# Patient Record
Sex: Female | Born: 1990 | Race: Black or African American | Hispanic: No | Marital: Single | Smoking: Never smoker
Health system: Southern US, Community
[De-identification: ages and names within clinical notes are randomized; demographics above are authoritative.]

---

## 2015-11-13 ENCOUNTER — Emergency Department (HOSPITAL_COMMUNITY): Payer: BLUE CROSS/BLUE SHIELD

## 2015-11-13 ENCOUNTER — Encounter (HOSPITAL_COMMUNITY): Payer: Self-pay

## 2015-11-13 ENCOUNTER — Emergency Department (HOSPITAL_COMMUNITY)
Admission: EM | Admit: 2015-11-13 | Discharge: 2015-11-13 | Disposition: A | Discharge status: Home / Self Care | Payer: BLUE CROSS/BLUE SHIELD | Attending: Emergency Medicine | Admitting: Emergency Medicine

## 2015-11-13 DIAGNOSIS — R197 Diarrhea, unspecified: Secondary | ICD-10-CM | POA: Diagnosis not present

## 2015-11-13 DIAGNOSIS — Z3202 Encounter for pregnancy test, result negative: Secondary | ICD-10-CM | POA: Diagnosis not present

## 2015-11-13 DIAGNOSIS — Z79899 Other long term (current) drug therapy: Secondary | ICD-10-CM | POA: Insufficient documentation

## 2015-11-13 DIAGNOSIS — R1011 Right upper quadrant pain: Secondary | ICD-10-CM

## 2015-11-13 DIAGNOSIS — R1084 Generalized abdominal pain: Secondary | ICD-10-CM

## 2015-11-13 DIAGNOSIS — R1110 Vomiting, unspecified: Secondary | ICD-10-CM

## 2015-11-13 DIAGNOSIS — R112 Nausea with vomiting, unspecified: Secondary | ICD-10-CM | POA: Insufficient documentation

## 2015-11-13 LAB — URINALYSIS, ROUTINE W REFLEX MICROSCOPIC
Bilirubin Urine: NEGATIVE
Glucose, UA: NEGATIVE mg/dL
Hgb urine dipstick: NEGATIVE
Ketones, ur: NEGATIVE mg/dL
LEUKOCYTES UA: NEGATIVE
Nitrite: NEGATIVE
PROTEIN: NEGATIVE mg/dL
Specific Gravity, Urine: 1.008 (ref 1.005–1.030)
pH: 6.5 (ref 5.0–8.0)

## 2015-11-13 LAB — COMPREHENSIVE METABOLIC PANEL
ALT: 20 U/L (ref 14–54)
AST: 19 U/L (ref 15–41)
Albumin: 4.5 g/dL (ref 3.5–5.0)
Alkaline Phosphatase: 73 U/L (ref 38–126)
Anion gap: 9 (ref 5–15)
BUN: 13 mg/dL (ref 6–20)
CHLORIDE: 107 mmol/L (ref 101–111)
CO2: 26 mmol/L (ref 22–32)
Calcium: 9.4 mg/dL (ref 8.9–10.3)
Creatinine, Ser: 0.89 mg/dL (ref 0.44–1.00)
Glucose, Bld: 97 mg/dL (ref 65–99)
POTASSIUM: 3.7 mmol/L (ref 3.5–5.1)
SODIUM: 142 mmol/L (ref 135–145)
Total Bilirubin: 0.7 mg/dL (ref 0.3–1.2)
Total Protein: 8.1 g/dL (ref 6.5–8.1)

## 2015-11-13 LAB — CBC
HCT: 36.8 % (ref 36.0–46.0)
Hemoglobin: 11.7 g/dL — ABNORMAL LOW (ref 12.0–15.0)
MCH: 27.2 pg (ref 26.0–34.0)
MCHC: 31.8 g/dL (ref 30.0–36.0)
MCV: 85.6 fL (ref 78.0–100.0)
PLATELETS: 402 10*3/uL — AB (ref 150–400)
RBC: 4.3 MIL/uL (ref 3.87–5.11)
RDW: 13.9 % (ref 11.5–15.5)
WBC: 5.5 10*3/uL (ref 4.0–10.5)

## 2015-11-13 LAB — LIPASE, BLOOD: LIPASE: 24 U/L (ref 11–51)

## 2015-11-13 LAB — PREGNANCY, URINE: PREG TEST UR: NEGATIVE

## 2015-11-13 MED ORDER — ONDANSETRON HCL 4 MG PO TABS: 4.0000 mg | ORAL_TABLET | Freq: Four times a day (QID) | ORAL | Status: AC

## 2015-11-13 MED ORDER — SODIUM CHLORIDE 0.9 % IV BOLUS (SEPSIS)
1000.0000 mL | Freq: Once | INTRAVENOUS | Status: AC
  Administered 2015-11-13: 1000 mL via INTRAVENOUS

## 2015-11-13 MED ORDER — ONDANSETRON HCL 4 MG/2ML IJ SOLN
4.0000 mg | Freq: Once | INTRAMUSCULAR | Status: AC
  Administered 2015-11-13: 4 mg via INTRAVENOUS
  Filled 2015-11-13: qty 2

## 2015-11-13 MED ORDER — MORPHINE SULFATE (PF) 4 MG/ML IV SOLN
4.0000 mg | Freq: Once | INTRAVENOUS | Status: AC
  Administered 2015-11-13: 4 mg via INTRAVENOUS
  Filled 2015-11-13: qty 1

## 2015-11-13 NOTE — ED Notes (Signed)
Pt c/o RUQ abdominal pain radiating into R flank, emesis, and diarrhea starting this morning.  Pain score 5/10.  Pt has not taken anything for symptoms.  Pt reports that she is new the area.  Sts "I've had trouble w/ my gallbladder in the past."  Denies stones or sludge.  Sts "they just told me it was inflamed."

## 2015-11-13 NOTE — ED Provider Notes (Signed)
CSN: [id number]     Arrival date & time 11/13/15  1191 History   First MD Initiated Contact with Patient 11/13/15 [phone]     Chief Complaint  Patient presents with  . Abdominal Pain  . Emesis  . Diarrhea     (Consider location/radiation/quality/duration/timing/severity/associated sxs/prior Treatment) HPI  Pt presenting with c/o right upper abdominal pain.  Pt states that she has had similar pain in the past- but today pain is in right upper abdomen, radiating around to right back.  She also has been having some diarrhea that started last night and emesis this morning-nonbloody and nonbilious.  No fever/chills.  She has not had any treatment prior to arrival. She has been told she has gallbladder inflammation before- has lost 20 pounds and symptoms had improved.  There are no other associated systemic symptoms, there are no other alleviating or modifying factors.   History reviewed. No pertinent past medical history. History reviewed. No pertinent past surgical history. History reviewed. No pertinent family history. Social History  Substance Use Topics  . Smoking status: Never Smoker   . Smokeless tobacco: None  . Alcohol Use: Yes     Comment: occ   OB History    No data available     Review of Systems  ROS reviewed and all otherwise negative except for mentioned in HPI    Allergies  Review of patient's allergies indicates not on file.  Home Medications   Prior to Admission medications   Medication Sig Start Date End Date Taking? Authorizing Provider  aspirin-acetaminophen-caffeine (EXCEDRIN MIGRAINE) [id number] MG tablet Take 2 tablets by mouth every 6 (six) hours as needed for headache.   Yes Historical Provider, MD  ibuprofen (ADVIL,MOTRIN) 200 MG tablet Take 400 mg by mouth every 6 (six) hours as needed for headache or moderate pain.   Yes Historical Provider, MD  lamoTRIgine (LAMICTAL) 200 MG tablet Take 200 mg by mouth daily.   Yes Historical Provider, MD  ondansetron  (ZOFRAN) 4 MG tablet Take 1 tablet (4 mg total) by mouth every 6 (six) hours. 11/13/15   Jerelyn Scott, MD  Pseudoeph-Doxylamine-DM-APAP (NYQUIL PO) Take 1 tablet by mouth daily as needed (cold symptoms).   Yes Historical Provider, MD   BP 123/67 mmHg  Pulse 70  Temp(Src) 97.9 F (36.6 C) (Oral)  Resp 14  SpO2 100%  LMP 11/04/2015  Vitals reviewed Physical Exam  Physical Examination: General appearance - alert, well appearing, and in no distress Mental status - alert, oriented to person, place, and time Eyes - no conjunctival injection no scleral icterus Mouth - mucous membranes moist, pharynx normal without lesions Chest - clear to auscultation, no wheezes, rales or rhonchi, symmetric air entry Heart - normal rate, regular rhythm, normal S1, S2, no murmurs, rubs, clicks or gallops Abdomen - soft, ttp in epigastric region and right upper abdomen, no gaurding or rebound tenderness, nondistended, no masses or organomegaly Neurological - alert, oriented, normal speech Extremities - peripheral pulses normal, no pedal edema, no clubbing or cyanosis Skin - normal coloration and turgor, no rashes  ED Course  Procedures (including critical care time) Labs Review Labs Reviewed  CBC - Abnormal; Notable for the following:    Hemoglobin 11.7 (*)    Platelets 402 (*)    All other components within normal limits  COMPREHENSIVE METABOLIC PANEL  LIPASE, BLOOD  URINALYSIS, ROUTINE W REFLEX MICROSCOPIC (NOT AT [facility])  PREGNANCY, URINE    Imaging Review US Abdomen Limited Ruq  11/13/2015  CLINICAL DATA:  Right upper quadrant abdominal pain for 1 year EXAM: US ABDOMEN LIMITED - RIGHT UPPER QUADRANT COMPARISON:  None. FINDINGS: Gallbladder: No gallstones or wall thickening visualized. No sonographic Murphy sign noted by sonographer. Common bile duct: Diameter: 3 mm.  Where visualized, no filling defect. Liver: No focal lesion identified. Within normal limits in parenchymal echogenicity. Antegrade  flow in the imaged portal venous system. IMPRESSION: Normal right upper quadrant ultrasound. Electronically Signed   By: Marnee Spring M.D.   On: 11/13/2015 11:07   I have personally reviewed and evaluated these images and lab results as part of my medical decision-making.   EKG Interpretation None      MDM   Final diagnoses:  Generalized abdominal pain  Non-intractable vomiting with nausea, vomiting of unspecified type    Pt presenting with c/o abdominal pain and vomtiing.  Labs and abdominal ultrasound are reassuring.  Pt feels much imrpoved after IV fluids and zofran.  She is tolerating po fluids without difficulty upon recheck.  Discharged with strict return precautions.  Pt agreeable with plan.    Jerelyn Scott, MD 11/13/15 1309

## 2015-11-13 NOTE — Discharge Instructions (Signed)
Return to the ED with any concerns including vomiting and not able to keep down liquids, fever/chills, worsening abdominal pain, decreased level of alertness/lethargy, or any other alarming symptoms °

## 2016-06-22 IMAGING — US US ABDOMEN LIMITED
1 series · 14 of 22 positions shown · non-contrast
Comparison: None.

CLINICAL DATA: Right upper quadrant abdominal pain for 1 year

EXAM:
US ABDOMEN LIMITED - RIGHT UPPER QUADRANT

[Series 1: us abdomen limited · 0.22mm/px · 14 of 22 slices shown]
[im 1/22]
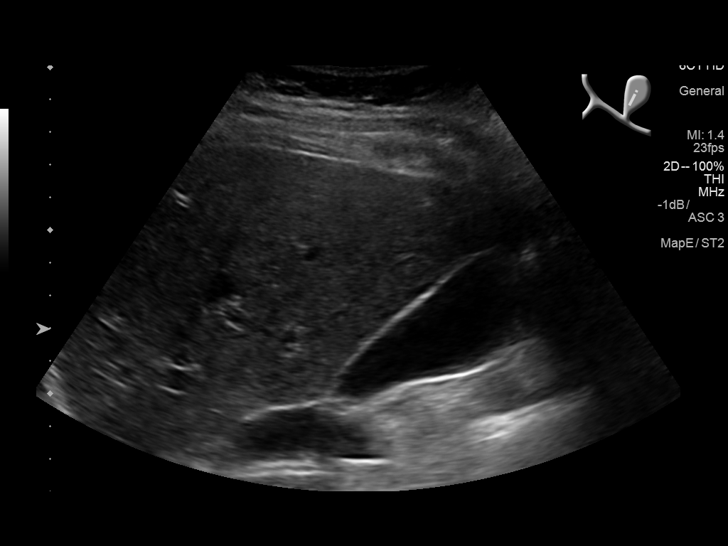
[im 3/22]
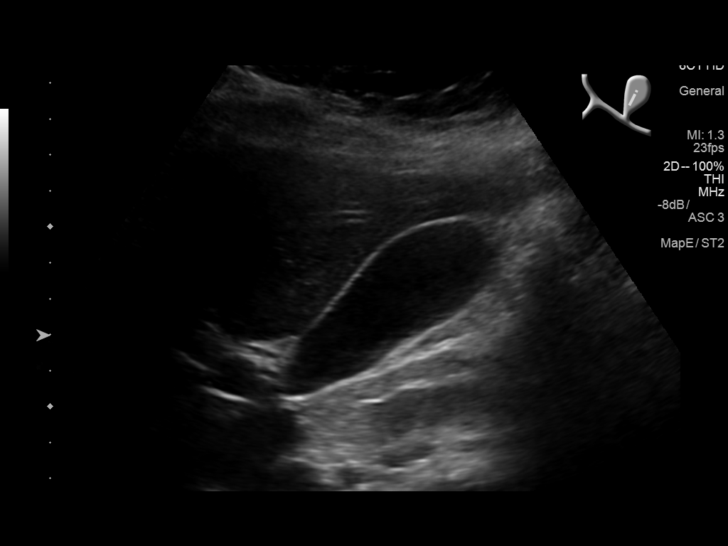
[im 4/22]
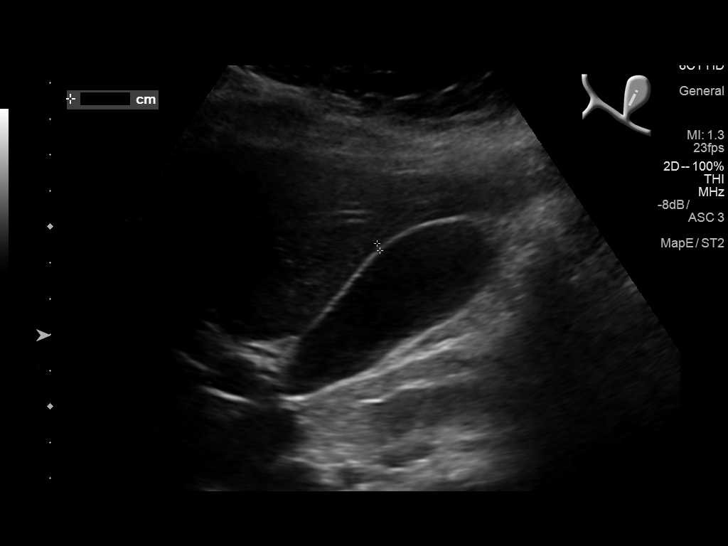
[im 6/22]
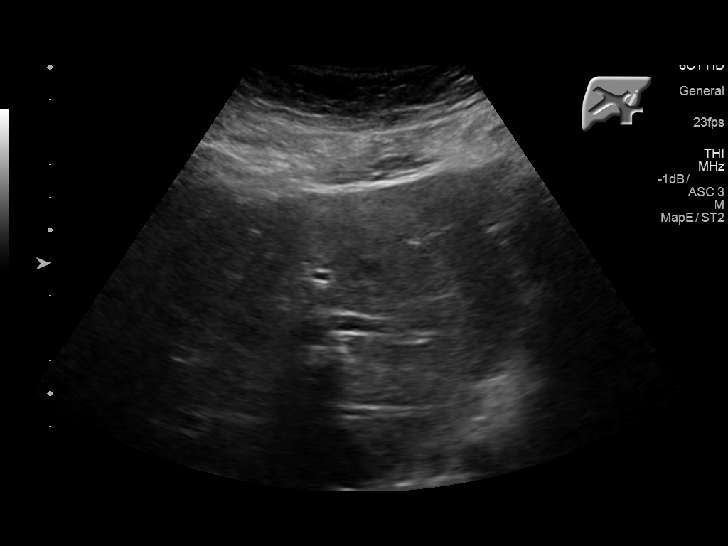
[im 8/22]
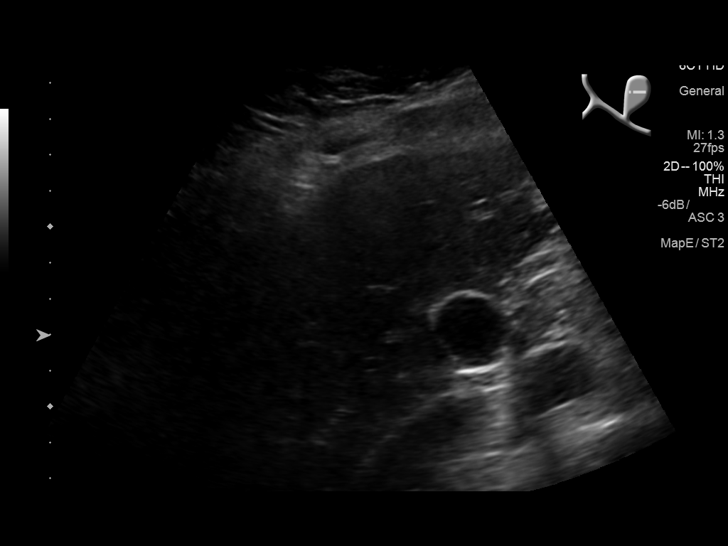
[im 9/22]
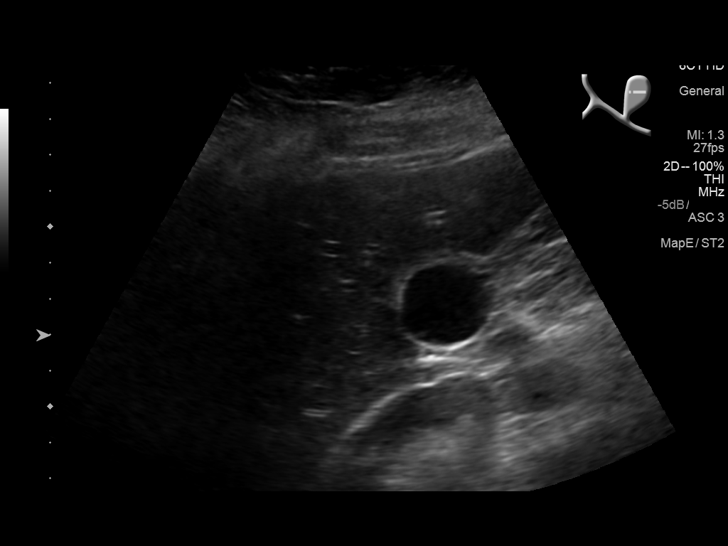
[im 11/22]
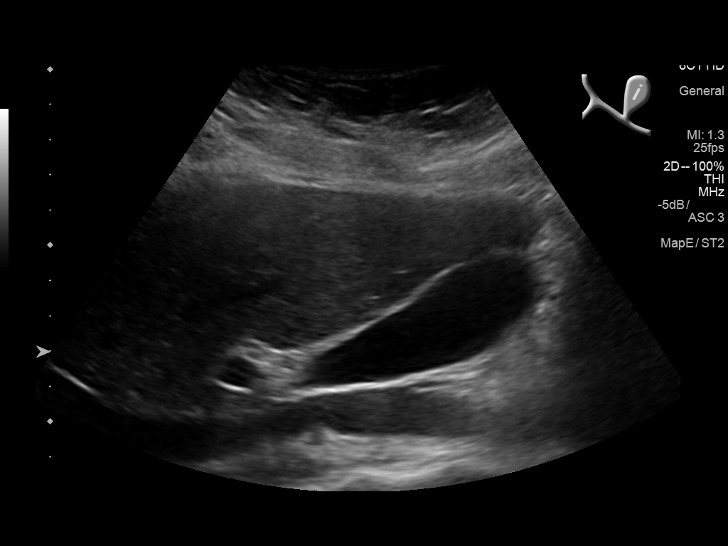
[im 12/22]
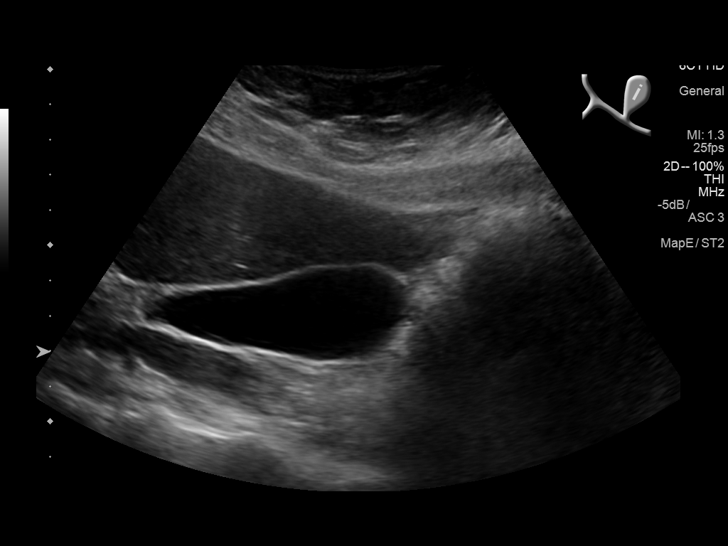
[im 14/22]
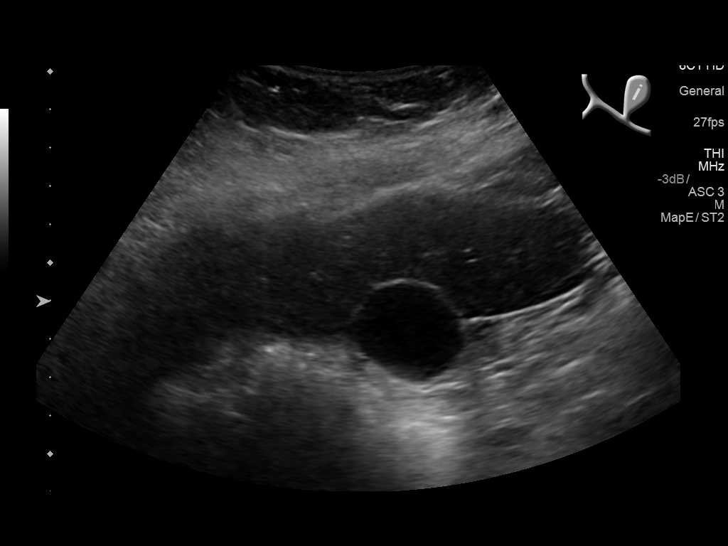
[im 15/22]
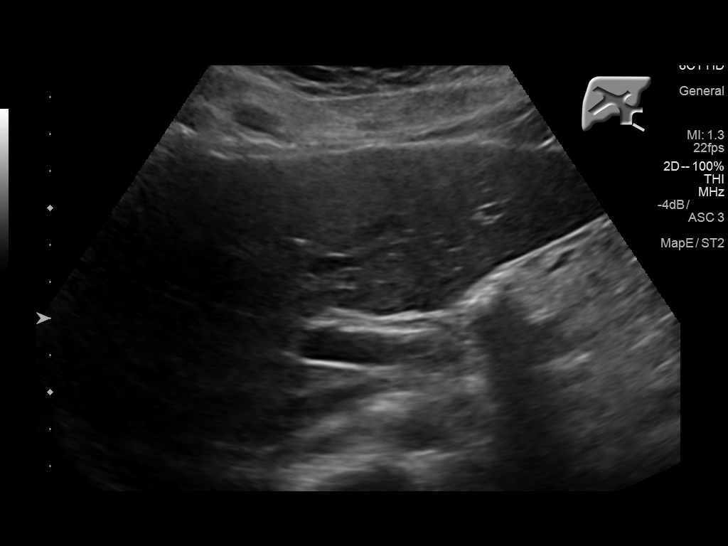
[im 17/22]
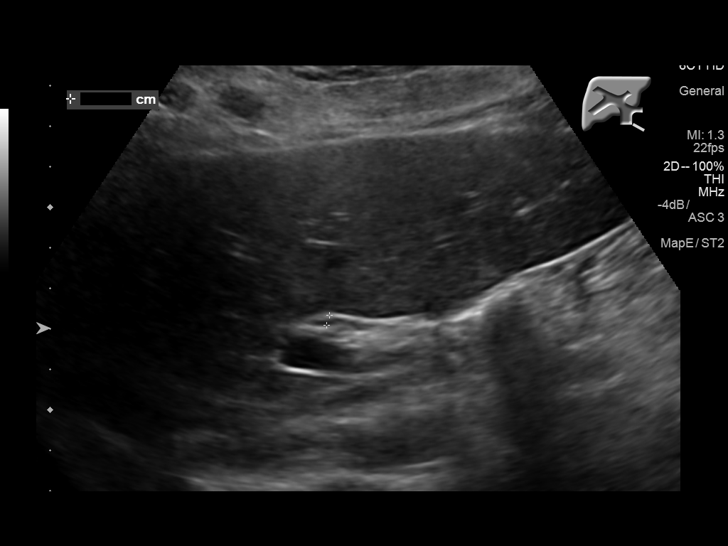
[im 19/22]
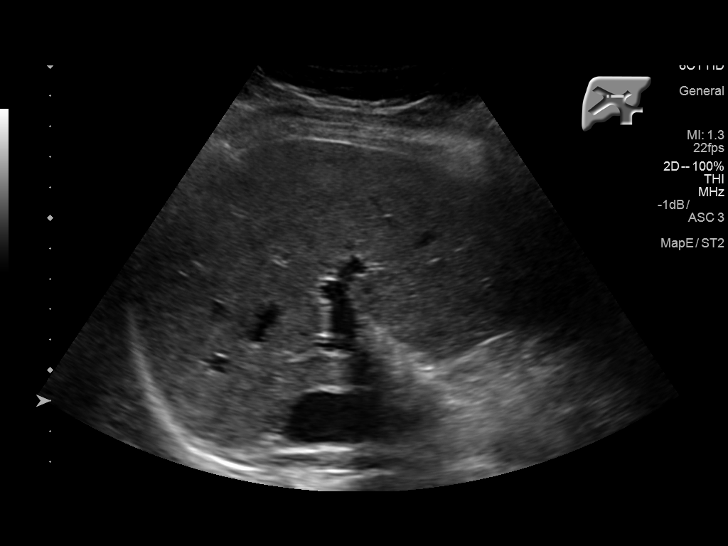
[im 20/22]
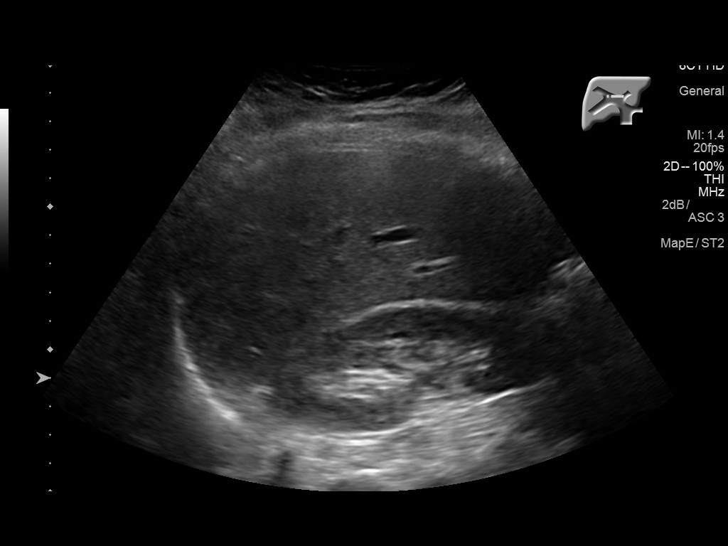
[im 22/22]
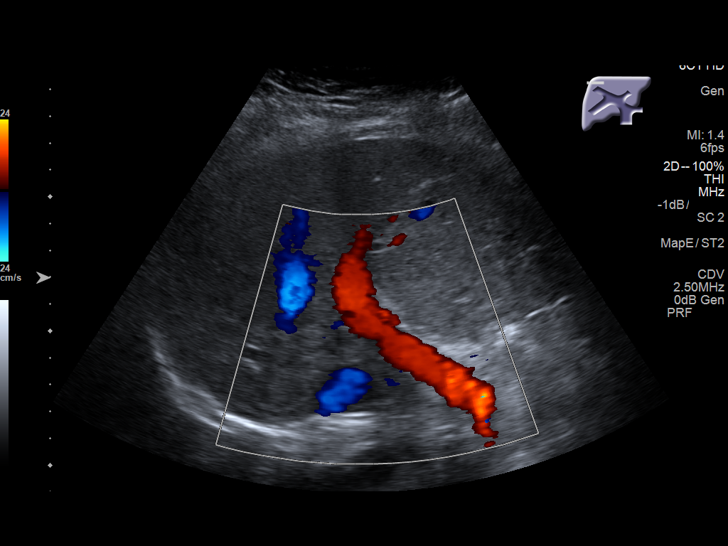

[14 of 22 positions shown; findings below may reference images not displayed]

FINDINGS: Gallbladder:

No gallstones or wall thickening visualized. No sonographic Murphy
sign noted by sonographer.

Common bile duct:

Diameter: 3 mm.  Where visualized, no filling defect.

Liver:

No focal lesion identified. Within normal limits in parenchymal
echogenicity. Antegrade flow in the imaged portal venous system.
IMPRESSION: Normal right upper quadrant ultrasound.
